# Patient Record
Sex: Male | Born: 1980 | Hispanic: No | Marital: Single | Smoking: Never smoker
Health system: Southern US, Community
[De-identification: ages and names within clinical notes are randomized; demographics above are authoritative.]

---

## 2012-05-06 ENCOUNTER — Emergency Department (INDEPENDENT_AMBULATORY_CARE_PROVIDER_SITE_OTHER): Payer: Self-pay

## 2012-05-06 ENCOUNTER — Encounter (HOSPITAL_COMMUNITY): Payer: Self-pay | Admitting: Emergency Medicine

## 2012-05-06 ENCOUNTER — Emergency Department (HOSPITAL_COMMUNITY)
Admission: EM | Admit: 2012-05-06 | Discharge: 2012-05-06 | Disposition: A | Discharge status: Home / Self Care | Payer: Self-pay | Source: Home / Self Care | Attending: Emergency Medicine | Admitting: Emergency Medicine

## 2012-05-06 DIAGNOSIS — S8000XA Contusion of unspecified knee, initial encounter: Secondary | ICD-10-CM

## 2012-05-06 DIAGNOSIS — S8002XA Contusion of left knee, initial encounter: Secondary | ICD-10-CM

## 2012-05-06 MED ORDER — MELOXICAM 7.5 MG PO TABS: 7.5000 mg | ORAL_TABLET | Freq: Every day | ORAL | Status: AC

## 2012-05-06 MED ORDER — TRAMADOL HCL 50 MG PO TABS: 50.0000 mg | ORAL_TABLET | Freq: Four times a day (QID) | ORAL | Status: AC | PRN
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
## 2012-05-06 NOTE — ED Notes (Signed)
Patient is not undress as instructed by Hinda Kehr.  Again encouraged patient to get undressed so physician could examine patient.

## 2013-05-18 ENCOUNTER — Emergency Department (HOSPITAL_COMMUNITY)
Admission: EM | Admit: 2013-05-18 | Discharge: 2013-05-18 | Disposition: A | Discharge status: Home / Self Care | Payer: Self-pay | Attending: Emergency Medicine | Admitting: Emergency Medicine

## 2013-05-18 ENCOUNTER — Encounter (HOSPITAL_COMMUNITY): Payer: Self-pay | Admitting: Emergency Medicine

## 2013-05-18 ENCOUNTER — Emergency Department (HOSPITAL_COMMUNITY): Payer: Self-pay

## 2013-05-18 DIAGNOSIS — Z79899 Other long term (current) drug therapy: Secondary | ICD-10-CM | POA: Insufficient documentation

## 2013-05-18 DIAGNOSIS — IMO0002 Reserved for concepts with insufficient information to code with codable children: Secondary | ICD-10-CM

## 2013-05-18 DIAGNOSIS — W268XXA Contact with other sharp object(s), not elsewhere classified, initial encounter: Secondary | ICD-10-CM | POA: Insufficient documentation

## 2013-05-18 DIAGNOSIS — S51809A Unspecified open wound of unspecified forearm, initial encounter: Secondary | ICD-10-CM | POA: Insufficient documentation

## 2013-05-18 DIAGNOSIS — Y9389 Activity, other specified: Secondary | ICD-10-CM | POA: Insufficient documentation

## 2013-05-18 DIAGNOSIS — W010XXA Fall on same level from slipping, tripping and stumbling without subsequent striking against object, initial encounter: Secondary | ICD-10-CM | POA: Insufficient documentation

## 2013-05-18 DIAGNOSIS — W01119A Fall on same level from slipping, tripping and stumbling with subsequent striking against unspecified sharp object, initial encounter: Secondary | ICD-10-CM | POA: Insufficient documentation

## 2013-05-18 DIAGNOSIS — Y92009 Unspecified place in unspecified non-institutional (private) residence as the place of occurrence of the external cause: Secondary | ICD-10-CM | POA: Insufficient documentation

## 2013-05-18 DIAGNOSIS — Z792 Long term (current) use of antibiotics: Secondary | ICD-10-CM | POA: Insufficient documentation

## 2013-05-18 MED ORDER — NEOMYCIN-POLYMYXIN-PRAMOXINE 1 % EX CREA: TOPICAL_CREAM | Freq: Two times a day (BID) | CUTANEOUS | Status: AC
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
## 2013-05-18 NOTE — Discharge Instructions (Signed)
Suture Removal, Care After Refer to this sheet in the next few weeks. These instructions provide you with information on caring for yourself after your procedure. Your health care provider may also give you more specific instructions. Your treatment has been planned according to current medical practices, but problems sometimes occur. Call your health care provider if you have any problems or questions after your procedure. WHAT TO EXPECT AFTER THE PROCEDURE After your stitches (sutures) are removed, it is typical to have the following:  Some discomfort and swelling in the wound area.  Slight redness in the area. HOME CARE INSTRUCTIONS   If you have skin adhesive strips over the wound area, do not take the strips off. They will fall off on their own in a few days. If the strips remain in place after 14 days, you may remove them.  Change any bandages (dressings) at least once a day or as directed by your health care provider. If the bandage sticks, soak it off with warm, soapy water.  Apply cream or ointment only as directed by your health care provider. If using cream or ointment, wash the area with soap and water 2 times a day to remove all the cream or ointment. Rinse off the soap and pat the area dry with a clean towel.  Keep the wound area dry and clean. If the bandage becomes wet or dirty, or if it develops a bad smell, change it as soon as possible.  Continue to protect the wound from injury.  Use sunscreen when out in the sun. New scars become sunburned easily. SEEK MEDICAL CARE IF:  You have increasing redness, swelling, or pain in the wound.  You see pus coming from the wound.  You have a fever.  You notice a bad smell coming from the wound or dressing.  Your wound breaks open (edges not staying together). Document Released: 10/11/2000 Document Revised: 11/06/2012 Document Reviewed: 08/28/2012 [facility] Patient Information 2014 [place], [place]. Laceration Care, Adult A  laceration is a cut or lesion that goes through all layers of the skin and into the tissue just beneath the skin. TREATMENT  Some lacerations may not require closure. Some lacerations may not be able to be closed due to an increased risk of infection. It is important to see your caregiver as soon as possible after an injury to minimize the risk of infection and maximize the opportunity for successful closure. If closure is appropriate, pain medicines may be given, if needed. The wound will be cleaned to help prevent infection. Your caregiver will use stitches (sutures), staples, wound glue (adhesive), or skin adhesive strips to repair the laceration. These tools bring the skin edges together to allow for faster healing and a better cosmetic outcome. However, all wounds will heal with a scar. Once the wound has healed, scarring can be minimized by covering the wound with sunscreen during the day for 1 full year. HOME CARE INSTRUCTIONS  For sutures or staples:  Keep the wound clean and dry.  If you were given a bandage (dressing), you should change it at least once a day. Also, change the dressing if it becomes wet or dirty, or as directed by your caregiver.  Wash the wound with soap and water 2 times a day. Rinse the wound off with water to remove all soap. Pat the wound dry with a clean towel.  After cleaning, apply a thin layer of the antibiotic ointment as recommended by your caregiver. This will help prevent infection and keep  the dressing from sticking.  You may shower as usual after the first 24 hours. Do not soak the wound in water until the sutures are removed.  Only take over-the-counter or prescription medicines for pain, discomfort, or fever as directed by your caregiver.  Get your sutures or staples removed as directed by your caregiver. For skin adhesive strips:  Keep the wound clean and dry.  Do not get the skin adhesive strips wet. You may bathe carefully, using caution to keep  the wound dry.  If the wound gets wet, pat it dry with a clean towel.  Skin adhesive strips will fall off on their own. You may trim the strips as the wound heals. Do not remove skin adhesive strips that are still stuck to the wound. They will fall off in time. For wound adhesive:  You may briefly wet your wound in the shower or bath. Do not soak or scrub the wound. Do not swim. Avoid periods of heavy perspiration until the skin adhesive has fallen off on its own. After showering or bathing, gently pat the wound dry with a clean towel.  Do not apply liquid medicine, cream medicine, or ointment medicine to your wound while the skin adhesive is in place. This may loosen the film before your wound is healed.  If a dressing is placed over the wound, be careful not to apply tape directly over the skin adhesive. This may cause the adhesive to be pulled off before the wound is healed.  Avoid prolonged exposure to sunlight or tanning lamps while the skin adhesive is in place. Exposure to ultraviolet light in the first year will darken the scar.  The skin adhesive will usually remain in place for 5 to 10 days, then naturally fall off the skin. Do not pick at the adhesive film. You may need a tetanus shot if:  You cannot remember when you had your last tetanus shot.  You have never had a tetanus shot. If you get a tetanus shot, your arm may swell, get red, and feel warm to the touch. This is common and not a problem. If you need a tetanus shot and you choose not to have one, there is a rare chance of getting tetanus. Sickness from tetanus can be serious. SEEK MEDICAL CARE IF:   You have redness, swelling, or increasing pain in the wound.  You see a red line that goes away from the wound.  You have yellowish-white fluid (pus) coming from the wound.  You have a fever.  You notice a bad smell coming from the wound or dressing.  Your wound breaks open before or after sutures have been  removed.  You notice something coming out of the wound such as wood or glass.  Your wound is on your hand or foot and you cannot move a finger or toe. SEEK IMMEDIATE MEDICAL CARE IF:   Your pain is not controlled with prescribed medicine.  You have severe swelling around the wound causing pain and numbness or a change in color in your arm, hand, leg, or foot.  Your wound splits open and starts bleeding.  You have worsening numbness, weakness, or loss of function of any joint around or beyond the wound.  You develop painful lumps near the wound or on the skin anywhere on your body. MAKE SURE YOU:   Understand these instructions.  Will watch your condition.  Will get help right away if you are not doing well or get worse. Document Released:  01/16/2005 Document Revised: 04/10/2011 Document Reviewed: 07/12/2010 [facility] Patient Information 2014 [place], [place].

## 2014-03-10 IMAGING — CR DG KNEE COMPLETE 4+V*L*
4 series · 4 of 4 positions shown · non-contrast
Comparison: None.

CLINICAL DATA: Trauma with medial pain.  Limited range of motion.

LEFT KNEE - COMPLETE 4+ VIEW

[view not recorded (1 of 4)]
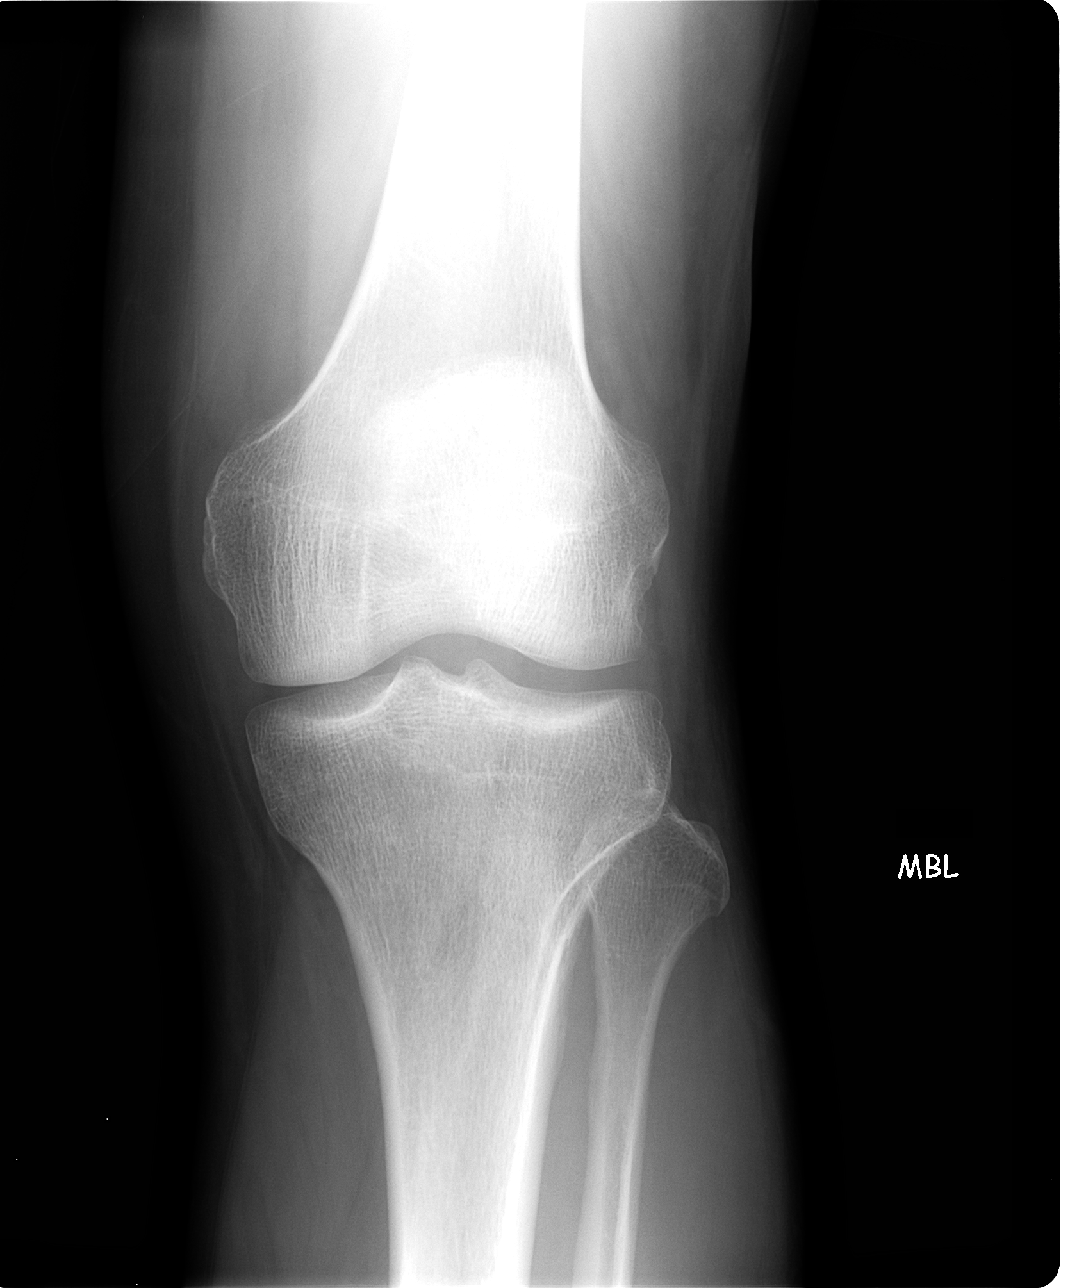

[view not recorded (2 of 4)]
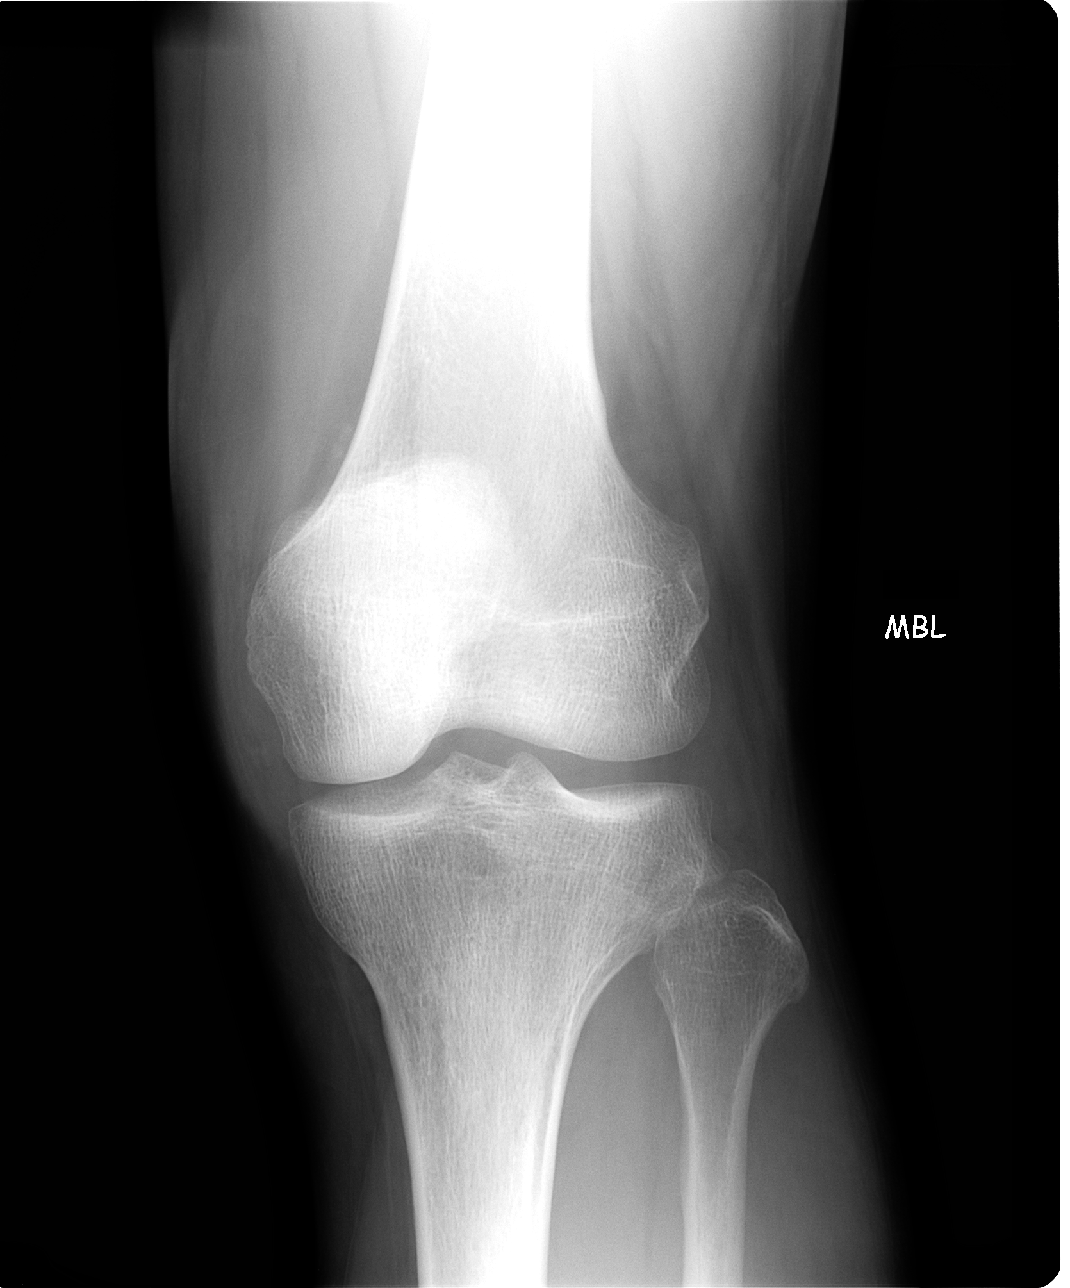

[view not recorded (3 of 4)]
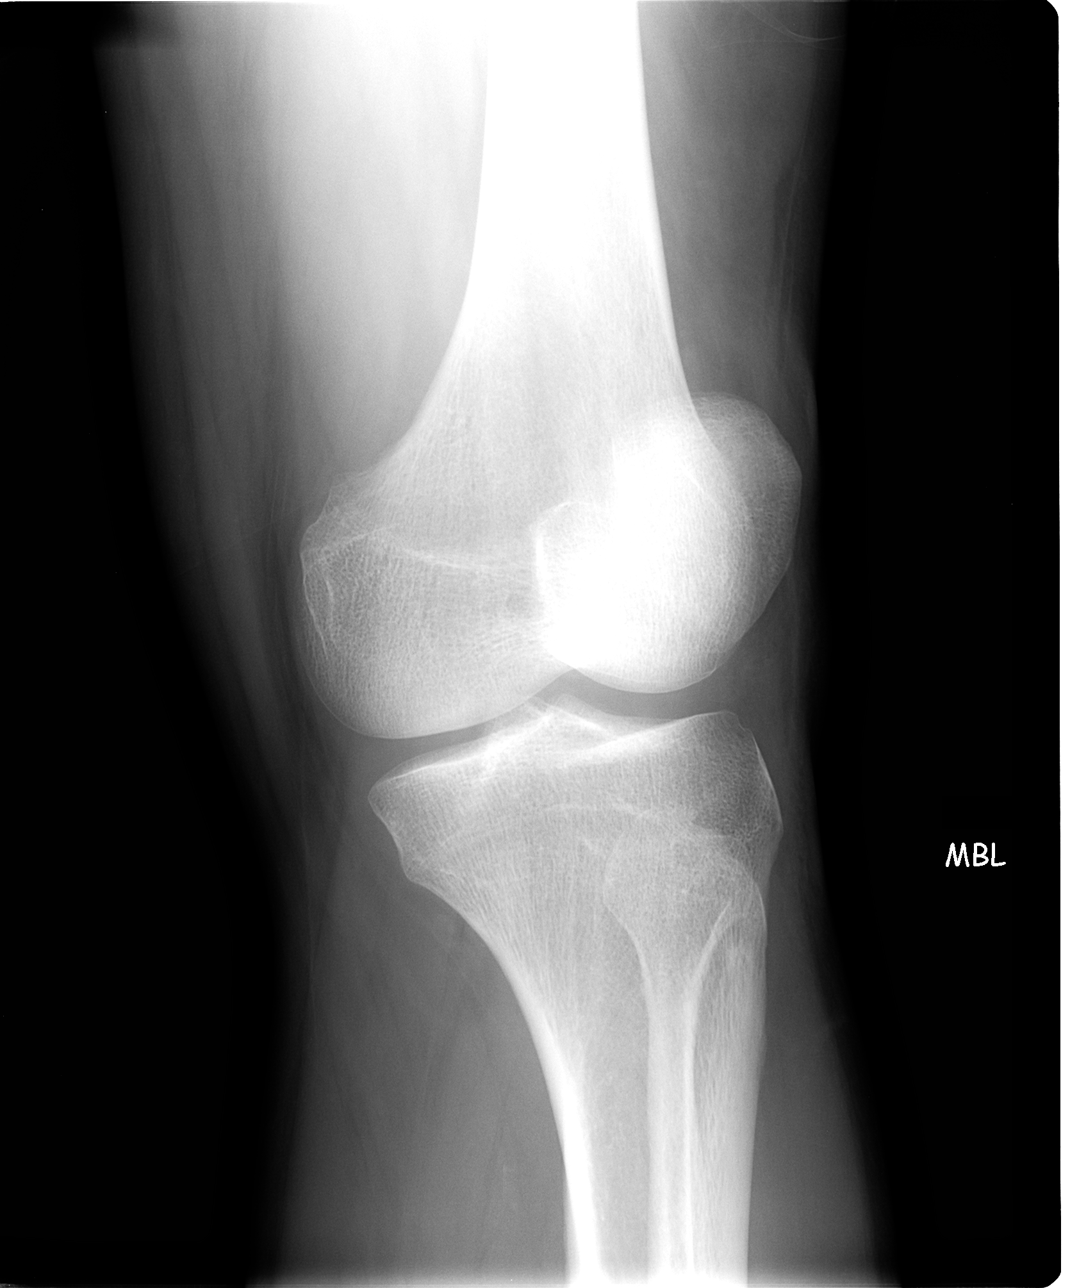

[view not recorded (4 of 4)]
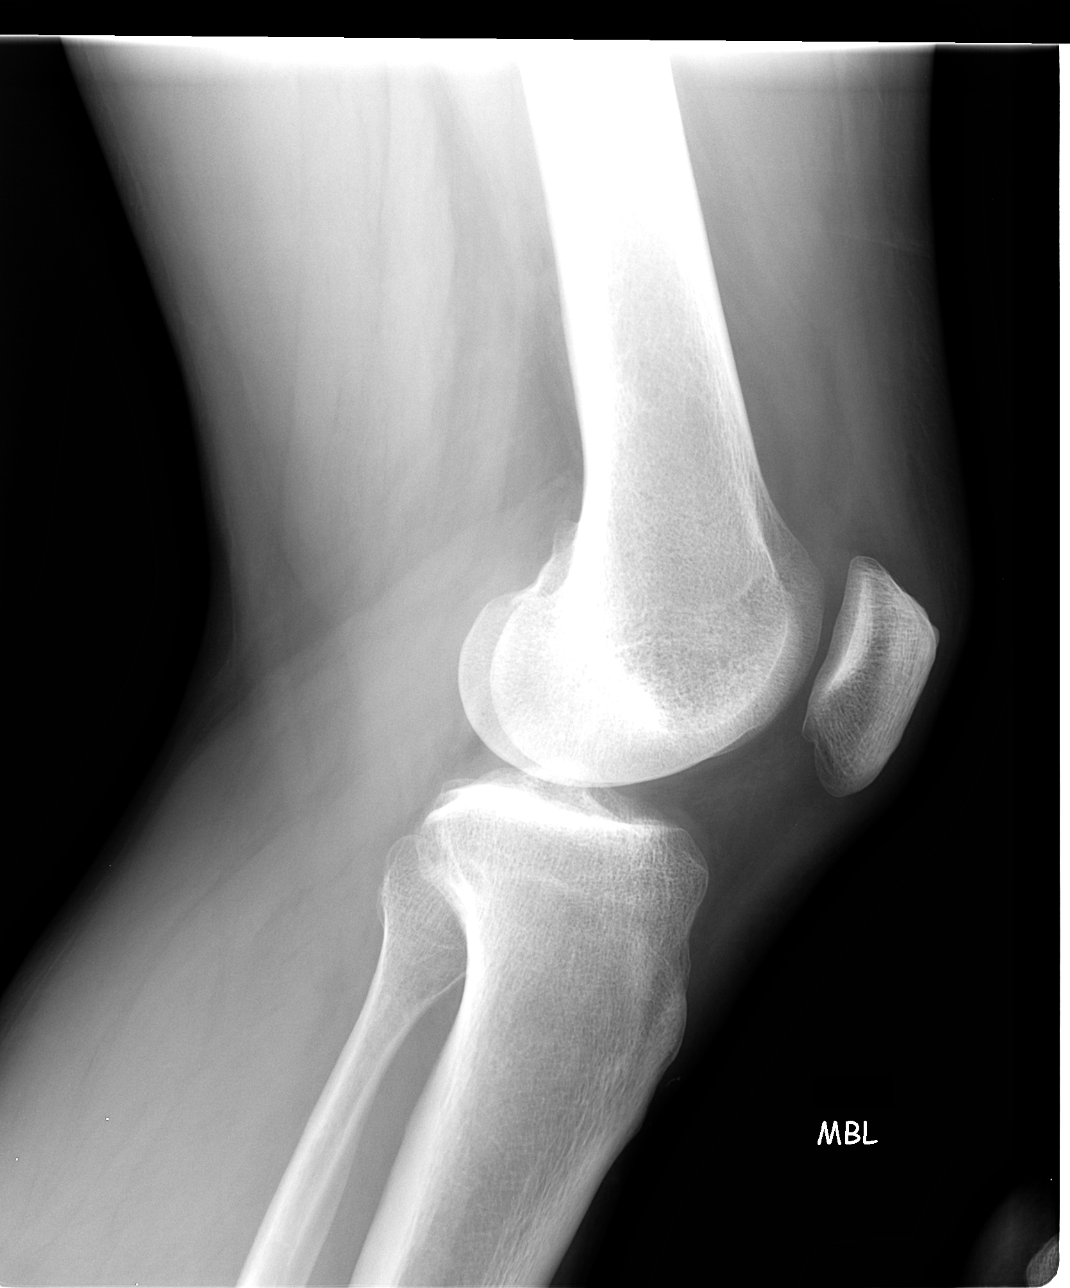

[4 of 4 positions shown; findings below may reference images not displayed]

FINDINGS: No acute fracture or dislocation.  No joint effusion.
Joint spaces maintained.
IMPRESSION: No acute osseous abnormality.
